# Patient Record
Sex: Female | Born: 2000 | Race: White | Hispanic: No | Marital: Single | Smoking: Never smoker
Health system: Southern US, Community
[De-identification: ages and names within clinical notes are randomized; demographics above are authoritative.]

---
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
## 2022-09-17 ENCOUNTER — Ambulatory Visit: Payer: Managed Care, Other (non HMO)

## 2022-09-17 ENCOUNTER — Ambulatory Visit (INDEPENDENT_AMBULATORY_CARE_PROVIDER_SITE_OTHER): Payer: Managed Care, Other (non HMO) | Admitting: Pulmonary Disease

## 2022-09-17 ENCOUNTER — Encounter: Payer: Self-pay | Admitting: Pulmonary Disease

## 2022-09-17 VITALS — BP 108/70 | HR 98 | Temp 97.7°F | Ht 61.0 in | Wt 132.0 lb

## 2022-09-17 DIAGNOSIS — R0609 Other forms of dyspnea: Secondary | ICD-10-CM

## 2022-09-17 NOTE — Progress Notes (Signed)
Bethany Reynolds    [id number]    2001/03/14  Primary Care Physician:Patient, No Pcp Per  Referring Physician: No referring provider defined for this encounter.  Chief complaint:   Chest discomfort   HPI:  Symptoms started about 3 weeks ago Pain around shoulders, pain around rib cage, traveling to right arm Pain in the front of the chest, not usually associated with breathing  History of knee pain with activity, does have some discomfort in a her knuckles chronically, no history of skin rash  Denies underlying history of lung disease, no history of heart disease No family history of lung disease  No history of asthma  She may have been exposed to some mold recently, musty smell in a car she was driving,  Denies any wheezing, denies shortness of breath with activity  Used to be able to exercise about 2-3 times a week without significant limitations  Never smoker  No illicit substance use  Does have a history with anxiety  She is a triplet  Outpatient Encounter Medications as of 09/17/2022  Medication Sig   cetirizine (ZYRTEC) 10 MG tablet Take 10 mg by mouth daily.   fluticasone (FLONASE) 50 MCG/ACT nasal spray Place into the nose.   [DISCONTINUED] methylPREDNISolone (MEDROL DOSEPAK) 4 MG TBPK tablet Take by mouth as directed.   No facility-administered encounter medications on file as of 09/17/2022.    Allergies as of 09/17/2022   (No Known Allergies)    No past medical history on file.  No family history on file.  Social History   Socioeconomic History   Marital status: Single    Spouse name: Not on file   Number of children: Not on file   Years of education: Not on file   Highest education level: Not on file  Occupational History   Not on file  Tobacco Use   Smoking status: Never    Passive exposure: Never   Smokeless tobacco: Never  Substance and Sexual Activity   Alcohol use: Never   Drug use: Never   Sexual activity: Not  Currently  Other Topics Concern   Not on file  Social History Narrative   Not on file   Social Determinants of Health   Financial Resource Strain: Not on file  Food Insecurity: Not on file  Transportation Needs: Not on file  Physical Activity: Not on file  Stress: Not on file  Social Connections: Not on file  Intimate Partner Violence: Not on file    Review of Systems  Respiratory:  Positive for chest tightness.   Musculoskeletal:  Positive for arthralgias and back pain.    Vitals:   09/17/22 1056  BP: 108/70  Pulse: 98  Temp: 97.7 F (36.5 C)  SpO2: 99%     Physical Exam Constitutional:      Appearance: Normal appearance.  HENT:     Head: Normocephalic.     Mouth/Throat:     Mouth: Mucous membranes are moist.  Cardiovascular:     Rate and Rhythm: Normal rate and regular rhythm.     Heart sounds: No murmur heard.    No friction rub.  Pulmonary:     Effort: No respiratory distress.     Breath sounds: No stridor. No wheezing or rhonchi.  Musculoskeletal:     Cervical back: No rigidity or tenderness.  Neurological:     Mental Status: She is alert.  Psychiatric:        Mood and Affect:  Mood normal.    Data Reviewed: Reportedly, recent EKG within normal limits  Assessment:  Chest pain, chest discomfort Sounds musculoskeletal  No underlying lung disease  No significant shortness of breath  No ongoing signs of a bacterial infection , may have a viral process  Concerning history of chronic knee pain with no history of injury, pain in both knuckles  Does have a history of anxiety -This may be exacerbating symptoms  Plan/Recommendations: Obtain chest x-ray  Obtain pulmonary function test  May use anti-inflammatory to control symptoms  We will obtain CBC with differentials, ESR, CRP, ANA   Sherrilyn Rist MD [facility] 09/17/2022, 11:28 AM  CC: No ref. provider found

## 2022-09-17 NOTE — Patient Instructions (Addendum)
I will see you in about 3 weeks  Order chest x-ray today  Pulmonary function test at next visit  Blood work -CBC , ESR, CRP, ANA   Use ibuprofen as needed for pain and discomfort

## 2022-09-18 ENCOUNTER — Ambulatory Visit: Payer: Managed Care, Other (non HMO) | Admitting: Pulmonary Disease

## 2022-09-18 DIAGNOSIS — R0609 Other forms of dyspnea: Secondary | ICD-10-CM

## 2022-09-18 LAB — PULMONARY FUNCTION TEST
DL/VA % pred: 127 %
DL/VA: 6.16 ml/min/mmHg/L
DLCO cor % pred: 111 %
DLCO cor: 22.39 ml/min/mmHg
DLCO unc % pred: 111 %
DLCO unc: 22.39 ml/min/mmHg
FEF 25-75 Post: 2.7 L/sec
FEF 25-75 Pre: 1.43 L/sec
FEF2575-%Change-Post: 89 %
FEF2575-%Pred-Post: 76 %
FEF2575-%Pred-Pre: 40 %
FEV1-%Change-Post: 18 %
FEV1-%Pred-Post: 75 %
FEV1-%Pred-Pre: 63 %
FEV1-Post: 2.28 L
FEV1-Pre: 1.93 L
FEV1FVC-%Change-Post: 17 %
FEV1FVC-%Pred-Pre: 83 %
FEV6-%Change-Post: -1 %
FEV6-%Pred-Post: 76 %
FEV6-%Pred-Pre: 78 %
FEV6-Post: 2.64 L
FEV6-Pre: 2.69 L
FEV6FVC-%Pred-Post: 99 %
FEV6FVC-%Pred-Pre: 99 %
FVC-%Change-Post: 0 %
FVC-%Pred-Post: 78 %
FVC-%Pred-Pre: 78 %
FVC-Post: 2.7 L
FVC-Pre: 2.69 L
Post FEV1/FVC ratio: 85 %
Post FEV6/FVC ratio: 100 %
Pre FEV1/FVC ratio: 72 %
Pre FEV6/FVC Ratio: 100 %
RV % pred: 183 %
RV: 1.97 L
TLC % pred: 95 %
TLC: 4.42 L

## 2022-09-18 NOTE — Patient Instructions (Signed)
Full PFT performed today. °

## 2022-09-18 NOTE — Progress Notes (Signed)
Normal chest x-ray.

## 2022-09-18 NOTE — Progress Notes (Signed)
Full PFT performed today. °

## 2022-09-19 ENCOUNTER — Other Ambulatory Visit (INDEPENDENT_AMBULATORY_CARE_PROVIDER_SITE_OTHER): Payer: Managed Care, Other (non HMO)

## 2022-09-19 ENCOUNTER — Other Ambulatory Visit: Payer: Self-pay | Admitting: Pulmonary Disease

## 2022-09-19 ENCOUNTER — Telehealth: Payer: Self-pay | Admitting: Pulmonary Disease

## 2022-09-19 DIAGNOSIS — R0609 Other forms of dyspnea: Secondary | ICD-10-CM | POA: Diagnosis not present

## 2022-09-19 DIAGNOSIS — R0602 Shortness of breath: Secondary | ICD-10-CM

## 2022-09-19 LAB — HEPATIC FUNCTION PANEL
ALT: 18 U/L (ref 0–35)
AST: 20 U/L (ref 0–37)
Albumin: 4.8 g/dL (ref 3.5–5.2)
Alkaline Phosphatase: 43 U/L (ref 39–117)
Bilirubin, Direct: 0.2 mg/dL (ref 0.0–0.3)
Total Bilirubin: 0.8 mg/dL (ref 0.2–1.2)
Total Protein: 7.8 g/dL (ref 6.0–8.3)

## 2022-09-19 LAB — BASIC METABOLIC PANEL
BUN: 10 mg/dL (ref 6–23)
CO2: 27 mEq/L (ref 19–32)
Calcium: 9.9 mg/dL (ref 8.4–10.5)
Chloride: 102 mEq/L (ref 96–112)
Creatinine, Ser: 0.79 mg/dL (ref 0.40–1.20)
GFR: 106.21 mL/min (ref >60.00)
Glucose, Bld: 91 mg/dL (ref 70–99)
Potassium: 3.7 mEq/L (ref 3.5–5.1)
Sodium: 138 mEq/L (ref 135–145)

## 2022-09-19 LAB — C-REACTIVE PROTEIN: CRP: <1 mg/dL (ref 0.5–20.0)

## 2022-09-19 NOTE — Telephone Encounter (Signed)
Dr. Jenetta Downer, please advise on the message from pt's mother Ivin Booty if you are okay with [place] ordering lft.

## 2022-09-20 LAB — SEDIMENTATION RATE: Sed Rate: 6 mm/hr (ref 0–20)

## 2022-09-20 LAB — ANA: Anti Nuclear Antibody (ANA): NEGATIVE

## 2022-09-22 ENCOUNTER — Other Ambulatory Visit: Payer: Self-pay | Admitting: Pulmonary Disease

## 2022-09-22 LAB — CBC WITH DIFFERENTIAL/PLATELET
Basophils Absolute: 0 10*3/uL (ref 0.0–0.1)
Basophils Relative: 0.4 % (ref 0.0–3.0)
Eosinophils Absolute: 0.1 10*3/uL (ref 0.0–0.7)
Eosinophils Relative: 1.1 % (ref 0.0–5.0)
HCT: 42.5 % (ref 36.0–46.0)
Hemoglobin: 14 g/dL (ref 12.0–15.0)
Lymphocytes Relative: 17.7 % (ref 12.0–46.0)
Lymphs Abs: 1.7 10*3/uL (ref 0.7–4.0)
MCHC: 32.9 g/dL (ref 30.0–36.0)
MCV: 94 fl (ref 78.0–100.0)
Monocytes Absolute: 0.6 10*3/uL (ref 0.1–1.0)
Monocytes Relative: 6.4 % (ref 3.0–12.0)
Neutro Abs: 7.2 10*3/uL (ref 1.4–7.7)
Neutrophils Relative %: 74 % (ref 43.0–77.0)
Platelets: 273 10*3/uL (ref 150.0–400.0)
RBC: 4.52 Mil/uL (ref 3.87–5.11)
RDW: 12.2 % (ref 11.5–15.5)
WBC: 9.8 10*3/uL (ref 4.0–10.5)

## 2022-09-22 MED ORDER — ALBUTEROL SULFATE HFA 108 (90 BASE) MCG/ACT IN AERS: 2.0000 | INHALATION_SPRAY | Freq: Four times a day (QID) | RESPIRATORY_TRACT | 6 refills | Status: AC | PRN

## 2022-09-22 NOTE — Telephone Encounter (Signed)
All blood work are negative present  Complete blood count Metabolic profile Markers of inflammation are negative  Pulmonary function test does show airway hyperresponsiveness-this may be seen in asthma, allergies, people with sinus concerns, bronchitis -Treatment is inhalers -Prescription for albuterol will be sent in-2 puffs to be used up to 4 times a day as needed for shortness of breath/wheezing  No further testing is indicated from a pulmonary perspective  General symptoms, symptoms like a headache should be addressed by patients primary doctor

## 2022-09-22 NOTE — Telephone Encounter (Signed)
Called and spoke with the pt's mother, Ivin Booty, ok per DPR She had called the answering service 09/20/22 to let us know that the pt had a headache  This has since resolved with use of ibuprofen  She has no PCP- advised would be good to get her one   She asked about labs and wants to know if we can add Anti-Jo 1  Looks like all of the labs that were done so far are unremarkable  Please advise, thanks!

## 2022-09-22 NOTE — Telephone Encounter (Signed)
Patient called answering service 10/14 and stated she was not feeling well/ bad headache. Per answering service note, "Not ER per caller" Please advise

## 2022-10-08 ENCOUNTER — Encounter: Payer: Self-pay | Admitting: Pulmonary Disease

## 2022-10-08 ENCOUNTER — Ambulatory Visit (INDEPENDENT_AMBULATORY_CARE_PROVIDER_SITE_OTHER): Payer: Managed Care, Other (non HMO) | Admitting: Pulmonary Disease

## 2022-10-08 VITALS — BP 104/70 | HR 74 | Ht 61.0 in | Wt 135.8 lb

## 2022-10-08 DIAGNOSIS — R0789 Other chest pain: Secondary | ICD-10-CM

## 2022-10-08 MED ORDER — MONTELUKAST SODIUM 10 MG PO TABS: 10.0000 mg | ORAL_TABLET | Freq: Every day | ORAL | 1 refills | Status: DC

## 2022-10-08 MED ORDER — CETIRIZINE HCL 10 MG PO TABS: 10.0000 mg | ORAL_TABLET | Freq: Every day | ORAL | 1 refills | Status: DC

## 2022-10-08 NOTE — Progress Notes (Signed)
Bethany Reynolds    [id number]    26-Mar-2001  Primary Care Physician:Patient, No Pcp Per  Referring Physician: No referring provider defined for this encounter.  Chief complaint:   Chest discomfort   HPI:  Was seen about 6 weeks ago continues to have some chest discomfort  Not having as much chest tightness Albuterol was prescribed for airway hyperresponsiveness  Bethany Reynolds was exposed to mold recently, noted a musty smell in a car Bethany Reynolds was driving Has no history of asthma Denies underlying history of lung disease no family history of lung disease  Pain around shoulders, pain around rib cage, traveling to right arm Pain in the front of the chest, not usually associated with breathing  History of knee pain with activity, does have some discomfort in a her knuckles chronically, no history of skin rash  Used to be able to exercise about 2-3 times a week without significant limitations  Never smoker  No illicit substance use  Does have a history with anxiety  Bethany Reynolds is a triplet  Outpatient Encounter Medications as of 10/08/2022  Medication Sig  . albuterol (VENTOLIN HFA) 108 (90 Base) MCG/ACT inhaler Inhale 2 puffs into the lungs every 6 (six) hours as needed for wheezing or shortness of breath.  . cetirizine (ZYRTEC) 10 MG tablet Take 10 mg by mouth daily.  . fluticasone (FLONASE) 50 MCG/ACT nasal spray Place into the nose.   No facility-administered encounter medications on file as of 10/08/2022.    Allergies as of 10/08/2022  . (No Known Allergies)    No past medical history on file.  No family history on file.  Social History   Socioeconomic History  . Marital status: Single    Spouse name: Not on file  . Number of children: Not on file  . Years of education: Not on file  . Highest education level: Not on file  Occupational History  . Not on file  Tobacco Use  . Smoking status: Never    Passive exposure: Never  . Smokeless tobacco: Never   Substance and Sexual Activity  . Alcohol use: Never  . Drug use: Never  . Sexual activity: Not Currently  Other Topics Concern  . Not on file  Social History Narrative  . Not on file   Social Determinants of Health   Financial Resource Strain: Not on file  Food Insecurity: Not on file  Transportation Needs: Not on file  Physical Activity: Not on file  Stress: Not on file  Social Connections: Not on file  Intimate Partner Violence: Not on file    Review of Systems  Respiratory:  Positive for chest tightness.   Musculoskeletal:  Positive for arthralgias and back pain.    Vitals:   10/08/22 1042  BP: 104/70  Pulse: 74  SpO2: 98%     Physical Exam Constitutional:      Appearance: Normal appearance.  HENT:     Head: Normocephalic.     Mouth/Throat:     Mouth: Mucous membranes are moist.  Cardiovascular:     Rate and Rhythm: Normal rate and regular rhythm.     Heart sounds: No murmur heard.    No friction rub.  Pulmonary:     Effort: No respiratory distress.     Breath sounds: No stridor. No wheezing or rhonchi.  Musculoskeletal:     Cervical back: No rigidity or tenderness.  Neurological:     Mental Status: Bethany Reynolds is alert.  Psychiatric:  Mood and Affect: Mood normal.   Data Reviewed: Reportedly, recent EKG within normal limits  Chest x-ray with no acute infiltrate  Pulmonary function test with airway hyperresponsiveness, significant bronchodilator response  Assessment:  Chest pain, chest discomfort Likely musculoskeletal  Bethany Reynolds does have airway hyperresponsiveness on PFT -Possibly this may be related to mold exposure causing hyperresponsiveness which is expected to be temporary -Albuterol should help  Concerning history of chronic knee pain with no history of injury, pain in both knuckles-markers of inflammation negative  Anxiety may still be contributing to symptoms  Plan/Recommendations: Albuterol as needed   May use anti-inflammatory to  control symptoms  We will obtain CBC with differentials, ESR, CRP, ANA   Sherrilyn Rist MD [facility] 10/08/2022, 10:48 AM  CC: No ref. provider found

## 2022-10-08 NOTE — Patient Instructions (Signed)
Your breathing study did show some hyperactivity  This may be related to your exposure recently  The expectation is that this will get better  Zyrtec for allergies Singulair can be used for allergies as well -If you want to try it, we can call in a prescription for it, it is usually a pill a day but you have to take it regularly to benefit  Use albuterol as needed if you have significant shortness of breath or wheezing  Follow-up in 3 to 4 months

## 2022-10-09 ENCOUNTER — Encounter: Payer: Self-pay | Admitting: Pulmonary Disease

## 2023-02-09 ENCOUNTER — Other Ambulatory Visit: Payer: Self-pay | Admitting: Pulmonary Disease

## 2023-04-05 ENCOUNTER — Other Ambulatory Visit: Payer: Self-pay | Admitting: Pulmonary Disease

## 2024-03-05 ENCOUNTER — Other Ambulatory Visit: Payer: Self-pay | Admitting: Pulmonary Disease
